# Patient Record
Sex: Male | Born: 1985 | Race: Asian | Hispanic: No | Marital: Married | Smoking: Never smoker
Health system: Southern US, Community
[De-identification: ages and names within clinical notes are randomized; demographics above are authoritative.]

---

## 2009-08-07 ENCOUNTER — Emergency Department (HOSPITAL_COMMUNITY): Admission: EM | Admit: 2009-08-07 | Discharge: 2009-08-07 | Payer: Self-pay | Admitting: Emergency Medicine

## 2010-10-25 ENCOUNTER — Emergency Department (HOSPITAL_COMMUNITY)
Admission: EM | Admit: 2010-10-25 | Discharge: 2010-10-26 | Disposition: A | Discharge status: Home / Self Care | Payer: PRIVATE HEALTH INSURANCE | Attending: Emergency Medicine | Admitting: Emergency Medicine

## 2010-10-25 ENCOUNTER — Emergency Department (HOSPITAL_COMMUNITY): Payer: PRIVATE HEALTH INSURANCE

## 2010-10-25 DIAGNOSIS — R6889 Other general symptoms and signs: Secondary | ICD-10-CM | POA: Insufficient documentation

## 2010-10-25 DIAGNOSIS — M542 Cervicalgia: Secondary | ICD-10-CM | POA: Insufficient documentation

## 2014-03-27 ENCOUNTER — Ambulatory Visit (INDEPENDENT_AMBULATORY_CARE_PROVIDER_SITE_OTHER): Payer: BC Managed Care – PPO | Admitting: Family Medicine

## 2014-03-27 VITALS — BP 122/74 | HR 75 | Temp 97.6°F | Resp 16 | Ht 71.0 in | Wt 141.6 lb

## 2014-03-27 DIAGNOSIS — L299 Pruritus, unspecified: Secondary | ICD-10-CM

## 2014-03-27 DIAGNOSIS — L500 Allergic urticaria: Secondary | ICD-10-CM

## 2014-03-27 MED ORDER — PREDNISONE 20 MG PO TABS: ORAL_TABLET | ORAL | Status: AC

## 2014-03-27 NOTE — Patient Instructions (Signed)
Use the prednisone as directed for itching and rash.  This should make you feel better in the next day or so.  You can also try claritin over the counter.  If you are getting worse or if you have any other concerns please let me know!

## 2014-03-27 NOTE — Progress Notes (Signed)
[facility] [address] [place] [address] [phone]  Date:  03/27/2014   Name:  Charles Sharp   DOB:  Aug 03, 1985   MRN:  [id number]  PCP:  PROVIDER NOT IN SYSTEM    Chief Complaint: Rash   History of Present Illness:  Charles Sharp is a 28 y.o. very pleasant male patient who presents with the following:  He is here today with an itchy rash on his body since yesterday.  His wife, children and in-laws live in the home and no one else has the same.  He has not traveled or slept away from home.  He is not aware of anything that could have triggered the rash- no new foods or medications  He has tried some OTC medications; benadryl, cortisone cream.  These have not helped much and the benadryl made him feel strange.  However he does not have any SOB or angioedema  He is otherwise generally healthy   There are no active problems to display for this patient.   History reviewed. No pertinent past medical history.  History reviewed. No pertinent past surgical history.  History  Substance Use Topics  . Smoking status: Never Smoker   . Smokeless tobacco: Not on file  . Alcohol Use: No    History reviewed. No pertinent family history.  No Known Allergies  Medication list has been reviewed and updated.  No current outpatient prescriptions on file prior to visit.   No current facility-administered medications on file prior to visit.    Review of Systems:  As per HPI- otherwise negative  Physical Examination: Filed Vitals:   03/27/14 1527  BP: 122/74  Pulse: 75  Temp: 97.6 F (36.4 C)  Resp: 16   Filed Vitals:   03/27/14 1527  Height:  (1.803 m)  Weight: 141 lb 9.6 oz (64.229 kg)   Body mass index is 19.76 kg/(m^2). Ideal Body Weight: Weight in (lb) to have BMI = 25: 178.9  GEN: WDWN, NAD, Non-toxic, A & O x 3, slim build, looks well HEENT: Atraumatic, Normocephalic. Neck supple. No masses, No LAD.  Bilateral TM wnl, oropharynx normal.   PEERL,EOMI.   Ears and Nose: No external deformity. CV: RRR, No M/G/R. No JVD. No thrill. No extra heart sounds. PULM: CTA B, no wheezes, crackles, rhonchi. No retractions. No resp. distress. No accessory muscle use. ABD: S, NT, ND EXTR: No c/c/e NEURO Normal gait.  PSYCH: Normally interactive. Conversant. Not depressed or anxious appearing.  Calm demeanor.  Scattered small urticaria, most dense on his waist and groin but also present on his right arm.  No genital lesions  Assessment and Plan: Itching - Plan: predniSONE (DELTASONE) 20 MG tablet  Allergic urticaria - Plan: predniSONE (DELTASONE) 20 MG tablet  Charles Sharp is here today with an allergic dermatitis/urticaria.  He is quite itchy and did not sleep well last night.  Will treat with prednisone as below.  He will let me know if not better soon- ,Sooner if worse.   Meds ordered this encounter  Medications  . predniSONE (DELTASONE) 20 MG tablet    Sig: Take 2 pills a day for 4 days, then 1 pill a day for 4 days    Dispense:  12 tablet    Refill:  0     Signed Abbe Amsterdam, MD

## 2021-03-19 ENCOUNTER — Other Ambulatory Visit: Payer: Self-pay | Admitting: Internal Medicine

## 2021-03-19 DIAGNOSIS — R229 Localized swelling, mass and lump, unspecified: Secondary | ICD-10-CM

## 2021-03-22 ENCOUNTER — Other Ambulatory Visit: Payer: Self-pay

## 2021-03-22 ENCOUNTER — Ambulatory Visit
Admission: RE | Admit: 2021-03-22 | Discharge: 2021-03-22 | Disposition: A | Discharge status: Home / Self Care | Payer: No Typology Code available for payment source | Source: Ambulatory Visit | Attending: Internal Medicine | Admitting: Internal Medicine

## 2021-03-22 DIAGNOSIS — R229 Localized swelling, mass and lump, unspecified: Secondary | ICD-10-CM

## 2022-10-24 IMAGING — US US SOFT TISSUE HEAD/NECK
1 series · 14 of 17 positions shown · non-contrast
Comparison: None.

CLINICAL DATA: Bilateral neck swelling

EXAM:
ULTRASOUND OF HEAD/NECK SOFT TISSUES
TECHNIQUE: Ultrasound examination of the head and neck soft tissues was
performed in the area of clinical concern.

[Series 1: us soft tissue head/neck · 0.06mm/px · 14 of 17 slices shown]
[im 1/17]
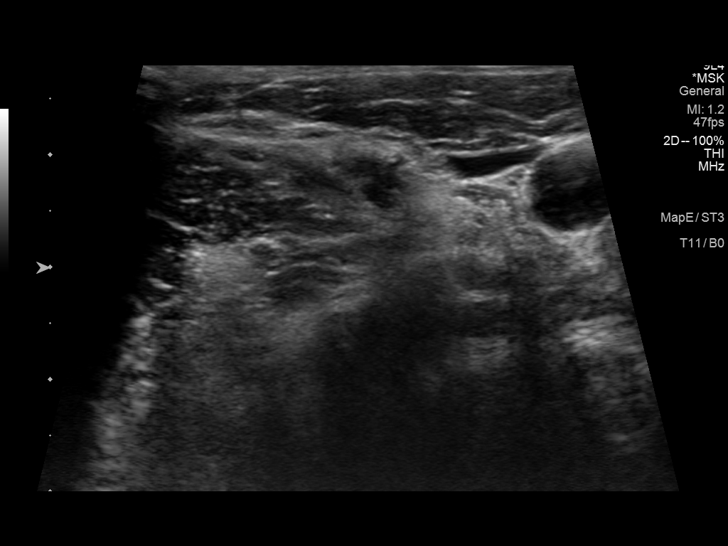
[im 2/17]
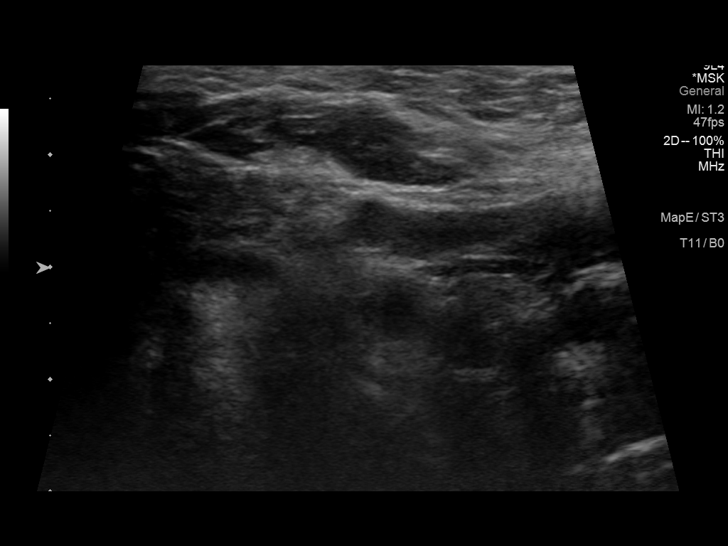
[im 4/17]
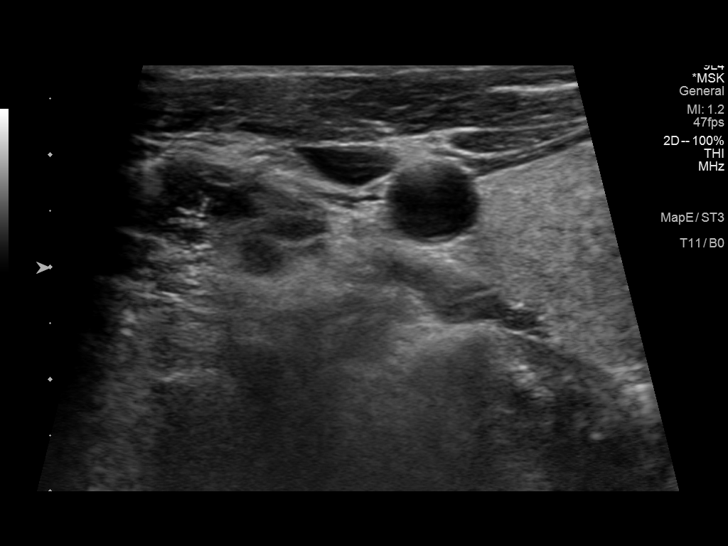
[im 5/17]
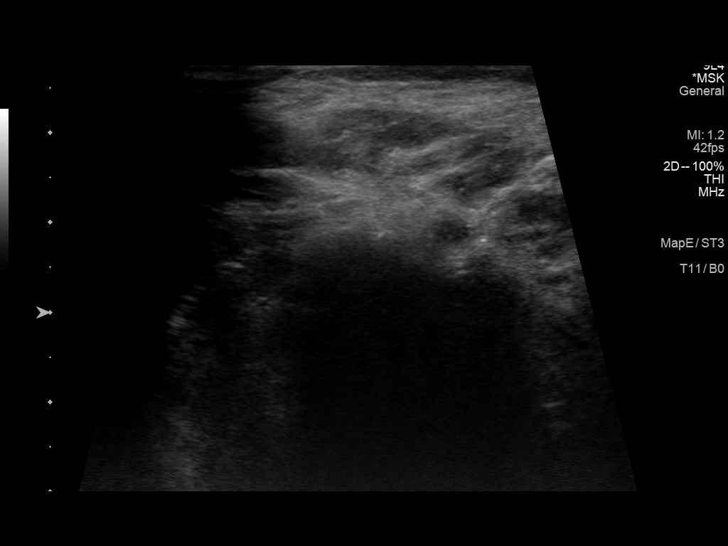
[im 6/17]
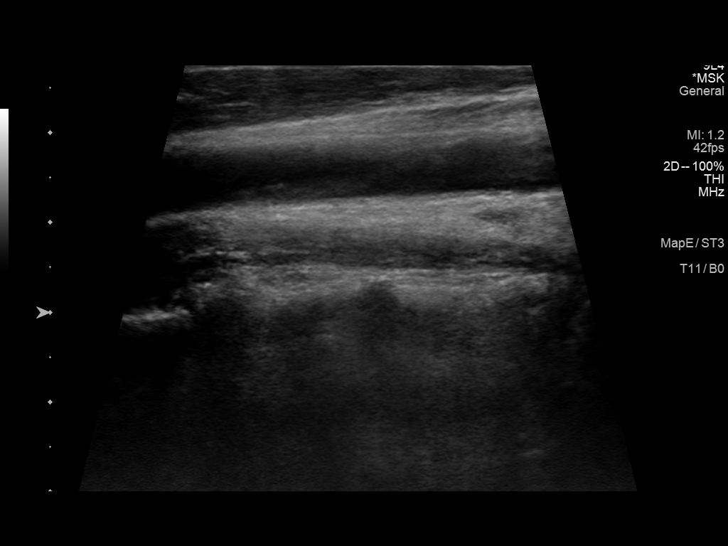
[im 7/17]
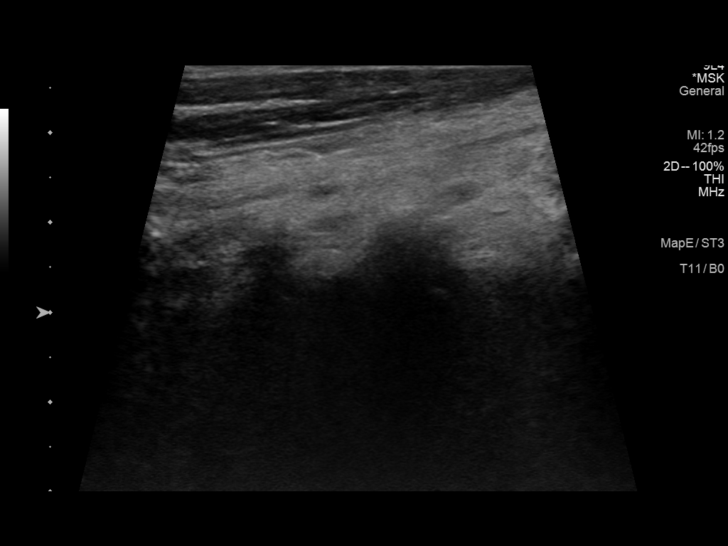
[im 8/17]
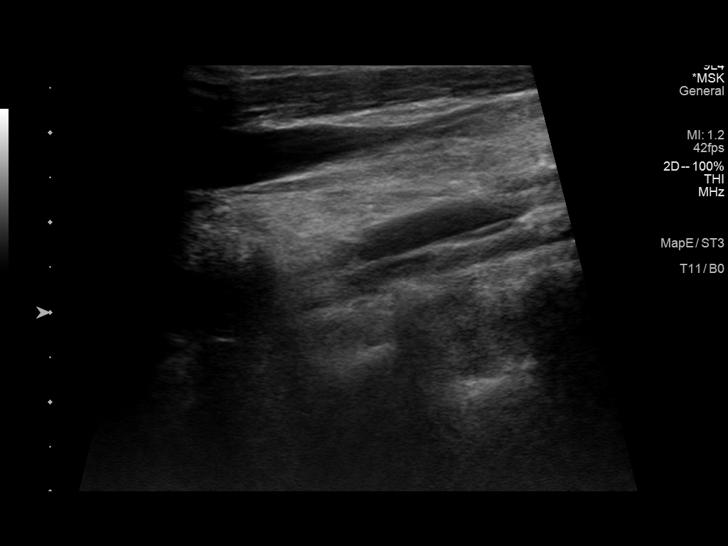
[im 10/17]
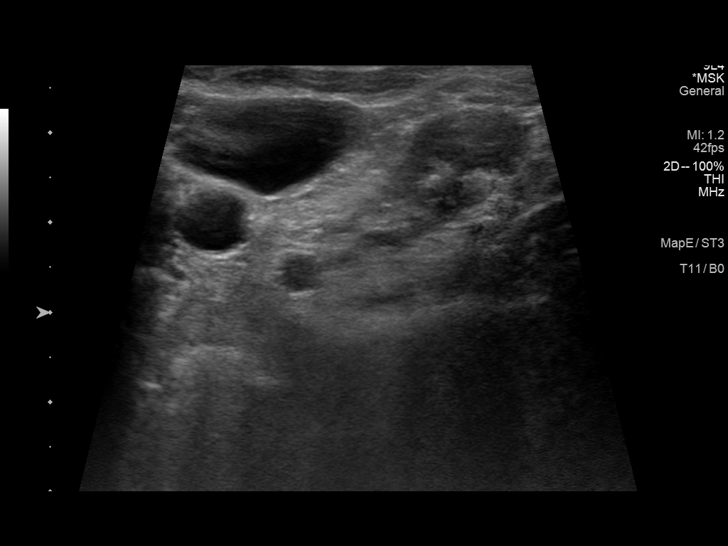
[im 11/17]
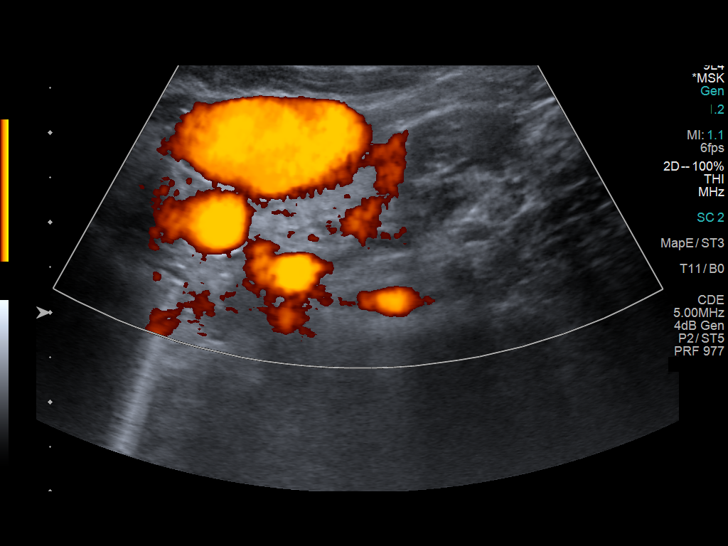
[im 12/17]
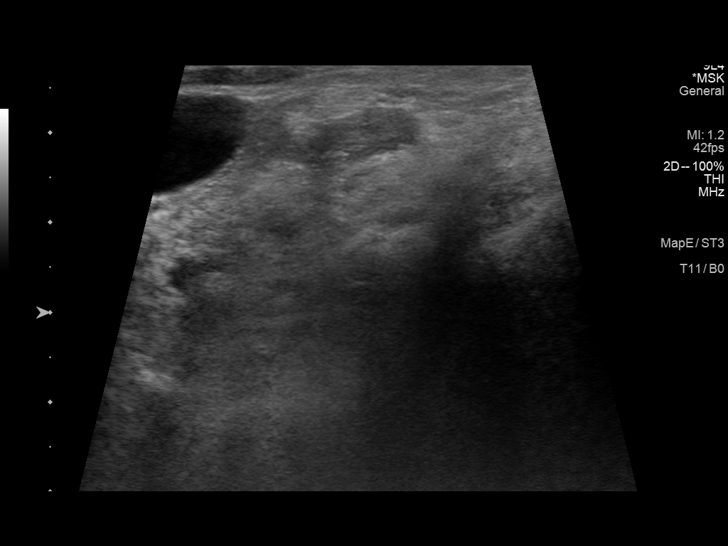
[im 13/17]
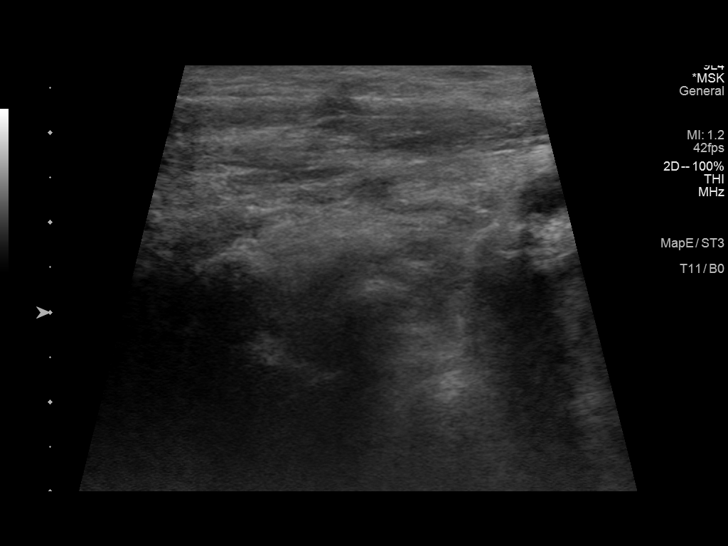
[im 14/17]
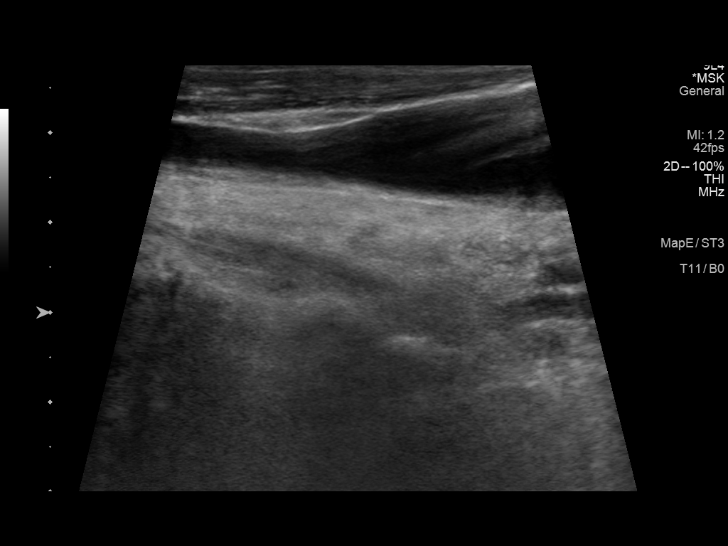
[im 16/17]
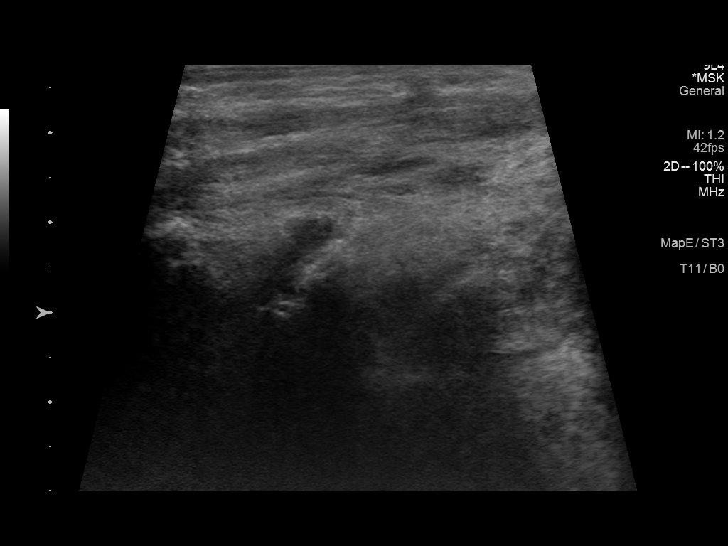
[im 17/17]
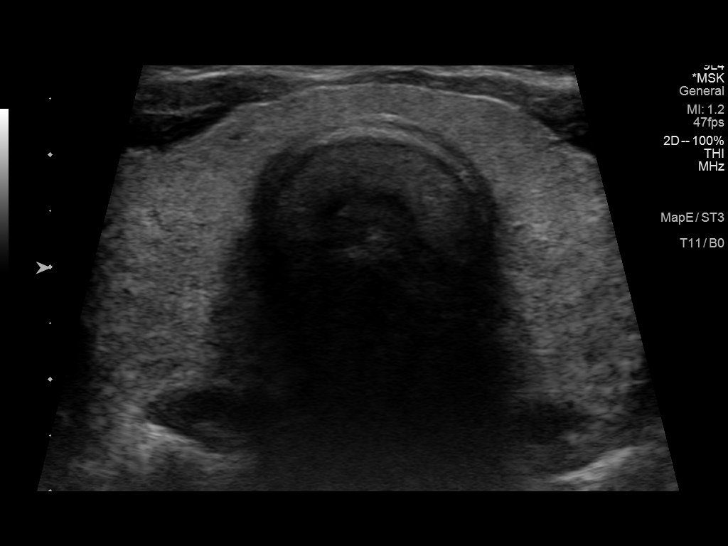

[14 of 17 positions shown; findings below may reference images not displayed]

FINDINGS: No sonographic abnormality of the neck soft tissues. Visualized
portions of the thyroid are unremarkable.
IMPRESSION: No sonographic abnormality of the visualized neck soft tissues. If
there is continued clinical concern for pathology in this region,
further evaluation with contrast enhanced CT should be performed.
